# Patient Record
Sex: Female | Born: 1970 | Race: White | Hispanic: No | Marital: Single | Smoking: Current every day smoker
Health system: Southern US, Community
[De-identification: ages and names within clinical notes are randomized; demographics above are authoritative.]

## PROBLEM LIST (undated history)

## (undated) DIAGNOSIS — J45909 Unspecified asthma, uncomplicated: Secondary | ICD-10-CM

## (undated) DIAGNOSIS — I10 Essential (primary) hypertension: Secondary | ICD-10-CM

## (undated) DIAGNOSIS — F419 Anxiety disorder, unspecified: Secondary | ICD-10-CM

## (undated) DIAGNOSIS — I209 Angina pectoris, unspecified: Secondary | ICD-10-CM

## (undated) DIAGNOSIS — F32A Depression, unspecified: Secondary | ICD-10-CM

## (undated) DIAGNOSIS — F329 Major depressive disorder, single episode, unspecified: Secondary | ICD-10-CM

## (undated) HISTORY — PX: APPENDECTOMY: SHX54

---

## 2015-04-05 DIAGNOSIS — F17210 Nicotine dependence, cigarettes, uncomplicated: Secondary | ICD-10-CM | POA: Insufficient documentation

## 2015-08-19 ENCOUNTER — Encounter (HOSPITAL_BASED_OUTPATIENT_CLINIC_OR_DEPARTMENT_OTHER): Payer: Self-pay | Admitting: *Deleted

## 2015-08-19 ENCOUNTER — Emergency Department (HOSPITAL_BASED_OUTPATIENT_CLINIC_OR_DEPARTMENT_OTHER)
Admission: EM | Admit: 2015-08-19 | Discharge: 2015-08-20 | Disposition: A | Discharge status: Home / Self Care | Payer: Medicaid Other | Attending: Emergency Medicine | Admitting: Emergency Medicine

## 2015-08-19 DIAGNOSIS — F17210 Nicotine dependence, cigarettes, uncomplicated: Secondary | ICD-10-CM | POA: Insufficient documentation

## 2015-08-19 DIAGNOSIS — R6884 Jaw pain: Secondary | ICD-10-CM | POA: Diagnosis not present

## 2015-08-19 DIAGNOSIS — I10 Essential (primary) hypertension: Secondary | ICD-10-CM | POA: Diagnosis not present

## 2015-08-19 DIAGNOSIS — R079 Chest pain, unspecified: Secondary | ICD-10-CM | POA: Insufficient documentation

## 2015-08-19 HISTORY — DX: Angina pectoris, unspecified: I20.9

## 2015-08-19 HISTORY — DX: Essential (primary) hypertension: I10

## 2015-08-19 NOTE — ED Notes (Signed)
Pt. Reports chest pain with jaw pain and chest pain radiating to the back with c/o nausea.

## 2015-08-20 ENCOUNTER — Emergency Department (HOSPITAL_BASED_OUTPATIENT_CLINIC_OR_DEPARTMENT_OTHER): Payer: Medicaid Other

## 2015-08-20 LAB — COMPREHENSIVE METABOLIC PANEL
ALBUMIN: 3.8 g/dL (ref 3.5–5.0)
ALK PHOS: 61 U/L (ref 38–126)
ALT: 10 U/L — AB (ref 14–54)
ANION GAP: 9 (ref 5–15)
AST: 13 U/L — ABNORMAL LOW (ref 15–41)
BILIRUBIN TOTAL: 0.3 mg/dL (ref 0.3–1.2)
BUN: 15 mg/dL (ref 6–20)
CALCIUM: 9.1 mg/dL (ref 8.9–10.3)
CO2: 20 mmol/L — ABNORMAL LOW (ref 22–32)
CREATININE: 0.86 mg/dL (ref 0.44–1.00)
Chloride: 110 mmol/L (ref 101–111)
GFR calc non Af Amer: >60 mL/min (ref >60)
GLUCOSE: 106 mg/dL — AB (ref 65–99)
Potassium: 3.9 mmol/L (ref 3.5–5.1)
Sodium: 139 mmol/L (ref 135–145)
TOTAL PROTEIN: 6.6 g/dL (ref 6.5–8.1)

## 2015-08-20 LAB — CBC WITH DIFFERENTIAL/PLATELET
BASOS ABS: 0 10*3/uL (ref 0.0–0.1)
BASOS PCT: 0 %
EOS ABS: 0.2 10*3/uL (ref 0.0–0.7)
Eosinophils Relative: 2 %
HEMATOCRIT: 36.5 % (ref 36.0–46.0)
Hemoglobin: 12.2 g/dL (ref 12.0–15.0)
Lymphocytes Relative: 34 %
Lymphs Abs: 2.6 10*3/uL (ref 0.7–4.0)
MCH: 27 pg (ref 26.0–34.0)
MCHC: 33.4 g/dL (ref 30.0–36.0)
MCV: 80.8 fL (ref 78.0–100.0)
MONO ABS: 0.7 10*3/uL (ref 0.1–1.0)
Monocytes Relative: 9 %
NEUTROS ABS: 4.3 10*3/uL (ref 1.7–7.7)
Neutrophils Relative %: 55 %
PLATELETS: 216 10*3/uL (ref 150–400)
RBC: 4.52 MIL/uL (ref 3.87–5.11)
RDW: 15.9 % — AB (ref 11.5–15.5)
WBC: 7.7 10*3/uL (ref 4.0–10.5)

## 2015-08-20 LAB — TROPONIN I

## 2015-08-20 MED ORDER — KETOROLAC TROMETHAMINE 60 MG/2ML IM SOLN
60.0000 mg | Freq: Once | INTRAMUSCULAR | Status: AC
  Administered 2015-08-20: 60 mg via INTRAMUSCULAR
  Filled 2015-08-20: qty 2

## 2015-08-20 MED ORDER — TRAMADOL HCL 50 MG PO TABS: 50.0000 mg | ORAL_TABLET | Freq: Four times a day (QID) | ORAL | Status: DC | PRN

## 2015-08-20 NOTE — Discharge Instructions (Signed)
Ibuprofen 600 mg 3 times daily for the next 5 days.  Tramadol as prescribed as needed for pain not relieved with ibuprofen.  Follow-up with your primary Dr. if things are not improving in the next 2-3 days, and return to the emergency department if symptoms significantly worsen or change.   Chest Wall Pain Chest wall pain is pain in or around the bones and muscles of your chest. Sometimes, an injury causes this pain. Sometimes, the cause may not be known. This pain may take several weeks or longer to get better. HOME CARE INSTRUCTIONS  Pay attention to any changes in your symptoms. Take these actions to help with your pain:   Rest as told by your health care provider.   Avoid activities that cause pain. These include any activities that use your chest muscles or your abdominal and side muscles to lift heavy items.   If directed, apply ice to the painful area:  Put ice in a plastic bag.  Place a towel between your skin and the bag.  Leave the ice on for 20 minutes, 2-3 times per day.  Take over-the-counter and prescription medicines only as told by your health care provider.  Do not use tobacco products, including cigarettes, chewing tobacco, and e-cigarettes. If you need help quitting, ask your health care provider.  Keep all follow-up visits as told by your health care provider. This is important. SEEK MEDICAL CARE IF:  You have a fever.  Your chest pain becomes worse.  You have new symptoms. SEEK IMMEDIATE MEDICAL CARE IF:  You have nausea or vomiting.  You feel sweaty or light-headed.  You have a cough with phlegm (sputum) or you cough up blood.  You develop shortness of breath.   This information is not intended to replace advice given to you by your health care provider. Make sure you discuss any questions you have with your health care provider.   Document Released: 04/13/2005 Document Revised: 01/02/2015 Document Reviewed: 07/09/2014 Elsevier Interactive  Patient Education [employer]2016 [employer] Inc.

## 2015-08-20 NOTE — ED Provider Notes (Signed)
CSN: [id number]     Arrival date & time 08/19/15  2341 History  By signing my name below, I, Linus Galas, attest that this documentation has been prepared under the direction and in the presence of Geoffery Lyons, MD. Electronically Signed: Linus Galas, ED Scribe. 08/20/2015. 12:04 AM.   Chief Complaint  Patient presents with  . Chest Pain   The history is provided by the patient. No language interpreter was used.   HPI Comments: Katherine Farrell is a 45 y.o. female who presents to the Emergency Department with a PMHx of HTN and angina pectoris complaining of achy chest pain that began an hour ago. Pt states her pain is closer to her left shoulder and "shoots to her back." Pt also reports nausea, mild SOB and a feeling in her jaw as if "she ate a whole bag of warheads." Pt states her CP is worse with movement. Pt denies any cough, vomiting, diarrhea, or any other symptoms at this time. Pt is not physical activity and denies any recent strenuous activity. She smokes .5 pack of cigarettes a day.   Pt has family history of cardiac chest pain. Pt mother began have pain when she was in her 12s.  Pt has seen a doctor for her chest pain. She was dx with angina and prescribed metoprolol and nitroglycerine. Pt has had several negative stress tests.   Pt recently moved from [place].   Past Medical History  Diagnosis Date  . Angina pectoris (HCC)     per Pt. heart valve issues.  . Hypertension    Past Surgical History  Procedure Laterality Date  . Appendectomy     No family history on file. Social History  Substance Use Topics  . Smoking status: Current Every Day Smoker -- 0.50 packs/day    Types: Cigarettes  . Smokeless tobacco: Not on file  . Alcohol Use: Yes     Comment: occasional   OB History    No data available     Review of Systems A complete 10 system review of systems was obtained and all systems are negative except as noted in the HPI and PMH.   Allergies  Review of  patient's allergies indicates no known allergies.  Home Medications   Prior to Admission medications   Not on File   BP 196/108 mmHg  Pulse 56  Temp(Src) 98.6 F (37 C) (Oral)  Resp 20  Ht  (1.651 m)  Wt 120 lb (54.432 kg)  BMI 19.97 kg/m2  SpO2 98%  LMP 08/19/2015 Physical Exam  Constitutional: She is oriented to person, place, and time. She appears well-developed and well-nourished. No distress.  HENT:  Head: Normocephalic and atraumatic.  Eyes: EOM are normal. Pupils are equal, round, and reactive to light.  Neck: Normal range of motion.  Cardiovascular: Normal rate, regular rhythm and normal heart sounds.   No murmur heard. Pulmonary/Chest: Effort normal and breath sounds normal. No respiratory distress. She has no wheezes. She has no rales. She exhibits tenderness.  TTP to the anterior chest wall.  Abdominal: Soft. Bowel sounds are normal. She exhibits no distension. There is no tenderness.  Musculoskeletal: Normal range of motion.  Neurological: She is alert and oriented to person, place, and time.  Skin: Skin is warm and dry.  Psychiatric: She has a normal mood and affect. Judgment normal.  Nursing note and vitals reviewed.   ED Course  Procedures  DIAGNOSTIC STUDIES: Oxygen Saturation is 98% on room air, normal by my interpretation.  COORDINATION OF CARE: 12:03 AM Will give Toradol. Will order CXR, EKG, and blood work. Discussed treatment plan with pt at bedside and pt agreed to plan.  Labs Review Labs Reviewed - No data to display  Imaging Review No results found. I have personally reviewed and evaluated these images and lab results as part of my medical decision-making.   EKG Interpretation   Date/Time:  Monday August 19 2015 23:52:16 EDT Ventricular Rate:  58 PR Interval:  122 QRS Duration: 80 QT Interval:  386 QTC Calculation: 378 R Axis:   76 Text Interpretation:  Sinus bradycardia Baseline wander Otherwise normal  ECG Confirmed by Reylene Stauder   MD, Emilyn Ruble ([id number]) on 08/19/2015 11:59:09 PM      MDM   Final diagnoses:  None    Patient presents with complaints of chest pain. Her discomfort is very reproducible with palpation of the chest wall and I highly suspect a musculoskeletal etiology. It appears as though this is not a new phenomenon for her. She reports similar episodes while living in [place]. She recently relocated here and has no primary doctor. Her EKG and troponin are negative and I feel as though she is appropriate for discharge. She was advised to obtain a primary Dr. here in the area. Her initial blood pressure was significantly elevated, however improved dramatically while she was here. She is to continue to follow this at home and keep a record of it.  I personally performed the services described in this documentation, which was scribed in my presence. The recorded information has been reviewed and is accurate.       Geoffery Lyonsouglas Aundra Pung, MD 08/20/15 0330

## 2015-08-20 NOTE — ED Notes (Signed)
Pt. Has had no insurance to get her regular meds  B/P meds for approx. 1 year.

## 2015-09-14 DIAGNOSIS — F332 Major depressive disorder, recurrent severe without psychotic features: Secondary | ICD-10-CM | POA: Insufficient documentation

## 2016-01-01 ENCOUNTER — Emergency Department (HOSPITAL_BASED_OUTPATIENT_CLINIC_OR_DEPARTMENT_OTHER)
Admission: EM | Admit: 2016-01-01 | Discharge: 2016-01-01 | Disposition: A | Discharge status: Home / Self Care | Payer: Medicaid Other | Attending: Emergency Medicine | Admitting: Emergency Medicine

## 2016-01-01 ENCOUNTER — Encounter (HOSPITAL_BASED_OUTPATIENT_CLINIC_OR_DEPARTMENT_OTHER): Payer: Self-pay | Admitting: Emergency Medicine

## 2016-01-01 DIAGNOSIS — I10 Essential (primary) hypertension: Secondary | ICD-10-CM | POA: Diagnosis not present

## 2016-01-01 DIAGNOSIS — F17210 Nicotine dependence, cigarettes, uncomplicated: Secondary | ICD-10-CM | POA: Insufficient documentation

## 2016-01-01 DIAGNOSIS — J45909 Unspecified asthma, uncomplicated: Secondary | ICD-10-CM | POA: Diagnosis not present

## 2016-01-01 DIAGNOSIS — R0602 Shortness of breath: Secondary | ICD-10-CM | POA: Diagnosis present

## 2016-01-01 DIAGNOSIS — Z79899 Other long term (current) drug therapy: Secondary | ICD-10-CM | POA: Insufficient documentation

## 2016-01-01 HISTORY — DX: Major depressive disorder, single episode, unspecified: F32.9

## 2016-01-01 HISTORY — DX: Unspecified asthma, uncomplicated: J45.909

## 2016-01-01 HISTORY — DX: Anxiety disorder, unspecified: F41.9

## 2016-01-01 HISTORY — DX: Depression, unspecified: F32.A

## 2016-01-01 MED ORDER — ALBUTEROL SULFATE HFA 108 (90 BASE) MCG/ACT IN AERS
1.0000 | INHALATION_SPRAY | Freq: Once | RESPIRATORY_TRACT | Status: AC
  Administered 2016-01-01: 2 via RESPIRATORY_TRACT
  Filled 2016-01-01: qty 6.7

## 2016-01-01 NOTE — ED Triage Notes (Addendum)
Cough and SOB intermittently for "couple days".  Worse tonight. Out of her inhaler and pmd will not refill again until until seen.  Breath sounds clear in triage.

## 2016-01-01 NOTE — Discharge Instructions (Signed)
Please read attached information. If you experience any new or worsening signs or symptoms please return to the emergency room for evaluation. Please follow-up with your primary care provider or specialist as discussed. Please use medication prescribed only as directed and discontinue taking if you have any concerning signs or symptoms.   °

## 2016-01-01 NOTE — ED Provider Notes (Signed)
[facility]   CSN: [id number] Arrival date & time: 01/01/16  2257     History   Chief Complaint Chief Complaint  Patient presents with  . Shortness of Breath    HPI Katherine Farrell is a 45 y.o. female.  HPI   45 year old female presents today with complaints of asthma exacerbation. Patient reports a significant past history of asthma allergies. She reports that over the last several days she's had increased shortness of breath, chest tightness, and mild cough. She reports this is identical to previous asthma related symptoms. Patient reports that she has run out of her nebulizer at home, has not used any medications. Patient denies any chest pain, abdominal pain, nausea vomiting or diarrhea. She denies any history of DVT or PE, or any significant risk factors.  Past Medical History:  Diagnosis Date  . Angina pectoris (HCC)    per Pt. heart valve issues.  . Anxiety   . Asthma   . Depression   . Hypertension     There are no active problems to display for this patient.   Past Surgical History:  Procedure Laterality Date  . APPENDECTOMY      OB History    No data available     Home Medications    Prior to Admission medications   Medication Sig Start Date End Date Taking? Authorizing Provider  escitalopram (LEXAPRO) 20 MG tablet Take 40 mg by mouth daily.   Yes Historical Provider, MD  GABAPENTIN PO Take 200 mg by mouth daily.   Yes Historical Provider, MD  metoprolol tartrate (LOPRESSOR) 25 MG tablet Take 25 mg by mouth daily.   Yes Historical Provider, MD  traMADol (ULTRAM) 50 MG tablet Take 1 tablet (50 mg total) by mouth every 6 (six) hours as needed. 08/20/15   Geoffery Lyonsouglas Delo, MD    Family History No family history on file.  Social History Social History  Substance Use Topics  . Smoking status: Current Every Day Smoker    Packs/day: 1.00    Types: Cigarettes  . Smokeless tobacco: Never Used  . Alcohol use Yes     Comment:  occasional     Allergies   Review of patient's allergies indicates no known allergies.   Review of Systems Review of Systems  All other systems reviewed and are negative.    Physical Exam Updated Vital Signs BP 133/83 (BP Location: Left Arm)   Pulse (!) 55   Temp 97.9 F (36.6 C) (Oral)   Resp 16   Ht 5\' 4"  (1.626 m)   Wt 59 kg   LMP 12/22/2015 (Exact Date)   SpO2 99%   BMI 22.31 kg/m   Physical Exam  Constitutional: She is oriented to person, place, and time. She appears well-developed and well-nourished.  HENT:  Head: Normocephalic and atraumatic.  Eyes: Conjunctivae are normal. Pupils are equal, round, and reactive to light. Right eye exhibits no discharge. Left eye exhibits no discharge. No scleral icterus.  Neck: Normal range of motion. No JVD present. No tracheal deviation present.  Cardiovascular: Normal rate, regular rhythm, normal heart sounds and intact distal pulses.   Pulmonary/Chest: Effort normal and breath sounds normal. No stridor. No respiratory distress. She has no wheezes. She has no rales. She exhibits no tenderness.  Musculoskeletal: She exhibits no edema.  Neurological: She is alert and oriented to person, place, and time. Coordination normal.  Psychiatric: She has a normal mood and affect. Her behavior is normal. Judgment and thought content  normal.  Nursing note and vitals reviewed.    ED Treatments / Results  Labs (all labs ordered are listed, but only abnormal results are displayed) Labs Reviewed - No data to display  EKG  EKG Interpretation None      Radiology No results found.  Procedures Procedures (including critical care time)  Medications Ordered in ED Medications  albuterol (PROVENTIL HFA;VENTOLIN HFA) 108 (90 Base) MCG/ACT inhaler 1-2 puff (2 puffs Inhalation Given 01/01/16 2341)     Initial Impression / Assessment and Plan / ED Course  I have reviewed the triage vital signs and the nursing notes.  Pertinent labs &  imaging results that were available during my care of the patient were reviewed by me and considered in my medical decision making (see chart for details).  Clinical Course    Final Clinical Impressions(s) / ED Diagnoses   Final diagnoses:  Asthma, unspecified asthma severity, uncomplicated   Labs:  Imaging:  Consults:  Therapeutics:  Discharge Meds:   Assessment/Plan:  45 year old female presents today with likely asthma exacerbation. Patient has reassuring vital signs, clear lung sounds. Patient reports this is similar to previous asthma related symptoms. She will be given an inhaler here in the ED and discharged home with symptomatic care instructions strict return precautions. Patient has no signs of PE DVT, denies any chest pain.    New Prescriptions New Prescriptions   No medications on file     Katherine Mechanic, PA-C 01/01/16 2344    Katherine Libra, MD 01/02/16 [phone]

## 2017-05-18 ENCOUNTER — Other Ambulatory Visit: Payer: Self-pay

## 2017-05-18 ENCOUNTER — Emergency Department (HOSPITAL_BASED_OUTPATIENT_CLINIC_OR_DEPARTMENT_OTHER)
Admission: EM | Admit: 2017-05-18 | Discharge: 2017-05-18 | Disposition: A | Discharge status: Home / Self Care | Payer: PRIVATE HEALTH INSURANCE | Attending: Emergency Medicine | Admitting: Emergency Medicine

## 2017-05-18 ENCOUNTER — Encounter (HOSPITAL_BASED_OUTPATIENT_CLINIC_OR_DEPARTMENT_OTHER): Payer: Self-pay

## 2017-05-18 DIAGNOSIS — I10 Essential (primary) hypertension: Secondary | ICD-10-CM | POA: Diagnosis not present

## 2017-05-18 DIAGNOSIS — F17210 Nicotine dependence, cigarettes, uncomplicated: Secondary | ICD-10-CM | POA: Insufficient documentation

## 2017-05-18 DIAGNOSIS — J111 Influenza due to unidentified influenza virus with other respiratory manifestations: Secondary | ICD-10-CM | POA: Diagnosis not present

## 2017-05-18 DIAGNOSIS — B349 Viral infection, unspecified: Secondary | ICD-10-CM

## 2017-05-18 DIAGNOSIS — Z79899 Other long term (current) drug therapy: Secondary | ICD-10-CM | POA: Diagnosis not present

## 2017-05-18 DIAGNOSIS — J45909 Unspecified asthma, uncomplicated: Secondary | ICD-10-CM | POA: Insufficient documentation

## 2017-05-18 DIAGNOSIS — R509 Fever, unspecified: Secondary | ICD-10-CM | POA: Diagnosis present

## 2017-05-18 DIAGNOSIS — R69 Illness, unspecified: Secondary | ICD-10-CM

## 2017-05-18 MED ORDER — BENZONATATE 100 MG PO CAPS: 100.0000 mg | ORAL_CAPSULE | Freq: Three times a day (TID) | ORAL | 0 refills | Status: AC

## 2017-05-18 MED ORDER — PROMETHAZINE-DM 6.25-15 MG/5ML PO SYRP: 5.0000 mL | ORAL_SOLUTION | Freq: Four times a day (QID) | ORAL | 0 refills | Status: AC | PRN

## 2017-05-18 NOTE — ED Triage Notes (Addendum)
Pt c/o body aches, fever, cough and sore throat, has been taking tylenol, last dose was at 1745, pt is very upset about having to wear a mask, states that she cannot breathe in it, exposure to another person with flu and PNA

## 2017-05-18 NOTE — ED Provider Notes (Signed)
[facility] EMERGENCY DEPARTMENT Provider Note   CSN: [id number] Arrival date & time: 05/18/17  1757     History   Chief Complaint Chief Complaint  Patient presents with  . URI    HPI Katherine Farrell is a 47 y.o. female. CC: fever and cough.  HPI` 47 year old female.  36-hour illness.  Awakened yesterday with symptoms.  Fevers, cough, body aches.  No nausea or vomiting.  Headache and sore throat.  "Feels like I got hit by a truck".  Was not vaccinated for influenza.  Past Medical History:  Diagnosis Date  . Angina pectoris (HCC)    per Pt. heart valve issues.  . Anxiety   . Asthma   . Depression   . Hypertension     There are no active problems to display for this patient.   Past Surgical History:  Procedure Laterality Date  . APPENDECTOMY      OB History    No data available       Home Medications    Prior to Admission medications   Medication Sig Start Date End Date Taking? Authorizing Provider  benzonatate (TESSALON) 100 MG capsule Take 1 capsule (100 mg total) by mouth every 8 (eight) hours. 05/18/17   Rolland Porter, MD  escitalopram (LEXAPRO) 20 MG tablet Take 40 mg by mouth daily.    [provider]  GABAPENTIN PO Take 200 mg by mouth daily.    [provider]  metoprolol tartrate (LOPRESSOR) 25 MG tablet Take 25 mg by mouth daily.    [provider]  promethazine-dextromethorphan (PROMETHAZINE-DM) 6.25-15 MG/5ML syrup Take 5 mLs by mouth 4 (four) times daily as needed for cough. 05/18/17   Rolland Porter, MD  traMADol (ULTRAM) 50 MG tablet Take 1 tablet (50 mg total) by mouth every 6 (six) hours as needed. 08/20/15   Geoffery Lyons, MD    Family History No family history on file.  Social History Social History   Tobacco Use  . Smoking status: Current Every Day Smoker    Packs/day: 1.00    Types: Cigarettes  . Smokeless tobacco: Never Used  Substance Use Topics  . Alcohol use: Yes    Comment: occasional  . Drug  use: No     Allergies   Patient has no known allergies.   Review of Systems Review of Systems  Constitutional: Positive for chills, fatigue and fever. Negative for appetite change and diaphoresis.  HENT: Positive for sore throat. Negative for mouth sores and trouble swallowing.   Eyes: Negative for visual disturbance.  Respiratory: Positive for cough. Negative for chest tightness, shortness of breath and wheezing.   Cardiovascular: Negative for chest pain.  Gastrointestinal: Negative for abdominal distention, abdominal pain, diarrhea, nausea and vomiting.  Endocrine: Negative for polydipsia, polyphagia and polyuria.  Genitourinary: Negative for dysuria, frequency and hematuria.  Musculoskeletal: Positive for myalgias. Negative for gait problem.  Skin: Negative for color change, pallor and rash.  Neurological: Positive for headaches. Negative for dizziness, syncope and light-headedness.  Hematological: Does not bruise/bleed easily.  Psychiatric/Behavioral: Negative for behavioral problems and confusion.     Physical Exam Updated Vital Signs BP 137/83 (BP Location: Left Arm)   Pulse (!) 57   Temp 98 F (36.7 C) (Oral)   Resp 18   Ht 5\' 4"  (1.626 m)   Wt 59 kg (130 lb)   LMP 04/20/2017   SpO2 100%   BMI 22.31 kg/m   Physical Exam  Constitutional: She is oriented to person, place,  and time. She appears well-developed and well-nourished. No distress.  Awake and alert.  Appears to not feel well but not toxic.  Mentating well.  Not tachypneic.  Well oxygenated.  Temp 100.1.  HENT:  Head: Normocephalic.  Eyes: Conjunctivae are normal. Pupils are equal, round, and reactive to light. No scleral icterus.  Neck: Normal range of motion. Neck supple. No thyromegaly present.  Cardiovascular: Normal rate and regular rhythm. Exam reveals no gallop and no friction rub.  No murmur heard. Pulmonary/Chest: Effort normal and breath sounds normal. No respiratory distress. She has no  wheezes. She has no rales.  Clear bilateral breath sounds.  No wheezing or prolongation.  No focal diminished breath sounds.  No increased work of breathing.  Abdominal: Soft. Bowel sounds are normal. She exhibits no distension. There is no tenderness. There is no rebound.  Musculoskeletal: Normal range of motion.  Neurological: She is alert and oriented to person, place, and time.  Skin: Skin is warm and dry. No rash noted.  Psychiatric: She has a normal mood and affect. Her behavior is normal.     ED Treatments / Results  Labs (all labs ordered are listed, but only abnormal results are displayed) Labs Reviewed - No data to display  EKG  EKG Interpretation None       Radiology No results found.  Procedures Procedures (including critical care time)  Medications Ordered in ED Medications - No data to display   Initial Impression / Assessment and Plan / ED Course  I have reviewed the triage vital signs and the nursing notes.  Pertinent labs & imaging results that were available during my care of the patient were reviewed by me and considered in my medical decision making (see chart for details).    Double influenza.  Had a Tamiflu discussion with her.  She politely declines.  Plan symptomatic treatment.  Rest, fluids, Motrin, Tylenol, cough suppressants.  Primary care follow-up if not improving.  Final Clinical Impressions(s) / ED Diagnoses   Final diagnoses:  Viral syndrome  Influenza-like illness    ED Discharge Orders        Ordered    promethazine-dextromethorphan (PROMETHAZINE-DM) 6.25-15 MG/5ML syrup  4 times daily PRN     05/18/17 2006    benzonatate (TESSALON) 100 MG capsule  Every 8 hours     05/18/17 2006       Rolland PorterJames, Inesha Sow, MD 05/18/17 2010

## 2017-05-18 NOTE — Discharge Instructions (Signed)
Tessalon for daytime cough.  Phenergan DM for nighttime cough.  Rest.  Push fluids.  Motrin and Tylenol for fever and pain.

## 2019-02-15 DIAGNOSIS — Z87891 Personal history of nicotine dependence: Secondary | ICD-10-CM | POA: Insufficient documentation

## 2019-02-15 DIAGNOSIS — I10 Essential (primary) hypertension: Secondary | ICD-10-CM | POA: Insufficient documentation

## 2019-02-15 DIAGNOSIS — F321 Major depressive disorder, single episode, moderate: Secondary | ICD-10-CM | POA: Insufficient documentation

## 2019-02-15 DIAGNOSIS — F411 Generalized anxiety disorder: Secondary | ICD-10-CM | POA: Insufficient documentation

## 2019-02-15 DIAGNOSIS — Z8709 Personal history of other diseases of the respiratory system: Secondary | ICD-10-CM | POA: Insufficient documentation

## 2019-02-16 DIAGNOSIS — E559 Vitamin D deficiency, unspecified: Secondary | ICD-10-CM | POA: Insufficient documentation

## 2019-02-16 DIAGNOSIS — E782 Mixed hyperlipidemia: Secondary | ICD-10-CM | POA: Insufficient documentation

## 2019-03-30 DIAGNOSIS — N921 Excessive and frequent menstruation with irregular cycle: Secondary | ICD-10-CM | POA: Insufficient documentation

## 2019-05-21 ENCOUNTER — Emergency Department (HOSPITAL_BASED_OUTPATIENT_CLINIC_OR_DEPARTMENT_OTHER): Payer: Medicaid Other

## 2019-05-21 ENCOUNTER — Encounter (HOSPITAL_BASED_OUTPATIENT_CLINIC_OR_DEPARTMENT_OTHER): Payer: Self-pay | Admitting: Emergency Medicine

## 2019-05-21 ENCOUNTER — Other Ambulatory Visit: Payer: Self-pay

## 2019-05-21 ENCOUNTER — Emergency Department (HOSPITAL_BASED_OUTPATIENT_CLINIC_OR_DEPARTMENT_OTHER)
Admission: EM | Admit: 2019-05-21 | Discharge: 2019-05-21 | Disposition: A | Discharge status: Home / Self Care | Payer: Medicaid Other | Attending: Emergency Medicine | Admitting: Emergency Medicine

## 2019-05-21 DIAGNOSIS — Z79899 Other long term (current) drug therapy: Secondary | ICD-10-CM | POA: Insufficient documentation

## 2019-05-21 DIAGNOSIS — U071 COVID-19: Secondary | ICD-10-CM | POA: Diagnosis not present

## 2019-05-21 DIAGNOSIS — R5383 Other fatigue: Secondary | ICD-10-CM | POA: Insufficient documentation

## 2019-05-21 DIAGNOSIS — R0602 Shortness of breath: Secondary | ICD-10-CM | POA: Diagnosis present

## 2019-05-21 DIAGNOSIS — I10 Essential (primary) hypertension: Secondary | ICD-10-CM | POA: Diagnosis not present

## 2019-05-21 DIAGNOSIS — F17210 Nicotine dependence, cigarettes, uncomplicated: Secondary | ICD-10-CM | POA: Diagnosis not present

## 2019-05-21 DIAGNOSIS — J4521 Mild intermittent asthma with (acute) exacerbation: Secondary | ICD-10-CM | POA: Diagnosis not present

## 2019-05-21 LAB — CBC WITH DIFFERENTIAL/PLATELET
Abs Immature Granulocytes: 0.04 10*3/uL (ref 0.00–0.07)
Basophils Absolute: 0 10*3/uL (ref 0.0–0.1)
Basophils Relative: 0 %
Eosinophils Absolute: 0.1 10*3/uL (ref 0.0–0.5)
Eosinophils Relative: 1 %
HCT: 40.4 % (ref 36.0–46.0)
Hemoglobin: 12.8 g/dL (ref 12.0–15.0)
Immature Granulocytes: 1 %
Lymphocytes Relative: 22 %
Lymphs Abs: 1.8 10*3/uL (ref 0.7–4.0)
MCH: 26.1 pg (ref 26.0–34.0)
MCHC: 31.7 g/dL (ref 30.0–36.0)
MCV: 82.4 fL (ref 80.0–100.0)
Monocytes Absolute: 0.5 10*3/uL (ref 0.1–1.0)
Monocytes Relative: 6 %
Neutro Abs: 5.8 10*3/uL (ref 1.7–7.7)
Neutrophils Relative %: 70 %
Platelets: 230 10*3/uL (ref 150–400)
RBC: 4.9 MIL/uL (ref 3.87–5.11)
RDW: 14.6 % (ref 11.5–15.5)
WBC: 8.4 10*3/uL (ref 4.0–10.5)
nRBC: 0 % (ref 0.0–0.2)

## 2019-05-21 LAB — BASIC METABOLIC PANEL
Anion gap: 6 (ref 5–15)
BUN: 9 mg/dL (ref 6–20)
CO2: 24 mmol/L (ref 22–32)
Calcium: 8.4 mg/dL — ABNORMAL LOW (ref 8.9–10.3)
Chloride: 108 mmol/L (ref 98–111)
Creatinine, Ser: 0.81 mg/dL (ref 0.44–1.00)
GFR calc Af Amer: >60 mL/min (ref >60)
GFR calc non Af Amer: >60 mL/min (ref >60)
Glucose, Bld: 96 mg/dL (ref 70–99)
Potassium: 3.7 mmol/L (ref 3.5–5.1)
Sodium: 138 mmol/L (ref 135–145)

## 2019-05-21 LAB — D-DIMER, QUANTITATIVE: D-Dimer, Quant: 1.99 ug/mL-FEU — ABNORMAL HIGH (ref 0.00–0.50)

## 2019-05-21 MED ORDER — PREDNISONE 20 MG PO TABS: ORAL_TABLET | ORAL | 0 refills | Status: DC

## 2019-05-21 MED ORDER — ALBUTEROL SULFATE HFA 108 (90 BASE) MCG/ACT IN AERS
4.0000 | INHALATION_SPRAY | Freq: Once | RESPIRATORY_TRACT | Status: AC
  Administered 2019-05-21: 4 via RESPIRATORY_TRACT
  Filled 2019-05-21: qty 6.7

## 2019-05-21 MED ORDER — IOHEXOL 350 MG/ML SOLN
100.0000 mL | Freq: Once | INTRAVENOUS | Status: AC | PRN
  Administered 2019-05-21: 100 mL via INTRAVENOUS

## 2019-05-21 MED ORDER — HYDROCOD POLST-CPM POLST ER 10-8 MG/5ML PO SUER: 5.0000 mL | Freq: Every evening | ORAL | 0 refills | Status: DC | PRN

## 2019-05-21 NOTE — ED Notes (Signed)
ED Provider at bedside. 

## 2019-05-21 NOTE — ED Notes (Signed)
O2 sat 96-98% while ambulating in room

## 2019-05-21 NOTE — ED Triage Notes (Signed)
Pt here COVID +05/10/19 and sx of SOB and fatigue have escalated since then with cough. Dyspnea on exertion.

## 2019-05-21 NOTE — ED Provider Notes (Signed)
[facility] EMERGENCY DEPARTMENT Provider Note   CSN: [id number] Arrival date & time: 05/21/19  1552     History Chief Complaint  Patient presents with  . Shortness of Breath  . Fatigue    Katherine Farrell is a 49 y.o. female.  Patient is a 49 year old female with a history of hypertension, asthma and depression who presents with shortness of breath and cough.  She has been feeling bad for about 10 days.  She was diagnosed with Covid on January 14.  She said that she has had some progressive increase in her shortness of breath over the last few days.  She has a worsening cough.  She finds it hard to sleep at night because of the coughing and the coughing tends to make her more short of breath.  She denies any vomiting or diarrhea.  No fevers.  She does have diffuse myalgias and significant fatigue.  She has been using her albuterol inhaler at home with increased frequency without much improvement in her shortness of breath.        Past Medical History:  Diagnosis Date  . Angina pectoris (HCC)    per Pt. heart valve issues.  . Anxiety   . Asthma   . Depression   . Hypertension     Patient Active Problem List   Diagnosis Date Noted  . Menorrhagia with irregular cycle 03/30/2019  . Mixed hyperlipidemia 02/16/2019  . Vitamin D deficiency 02/16/2019  . Current moderate episode of major depressive disorder without prior episode (HCC) 02/15/2019  . Essential hypertension 02/15/2019  . Generalized anxiety disorder 02/15/2019  . History of asthma 02/15/2019  . History of smoking 02/15/2019  . Severe episode of recurrent major depressive disorder (HCC) 09/14/2015  . Cigarette nicotine dependence, uncomplicated 04/05/2015    Past Surgical History:  Procedure Laterality Date  . APPENDECTOMY       OB History   No obstetric history on file.     History reviewed. No pertinent family history.  Social History   Tobacco Use  . Smoking status: Current Every Day  Smoker    Packs/day: 1.00    Types: Cigarettes  . Smokeless tobacco: Never Used  Substance Use Topics  . Alcohol use: Yes    Comment: occasional  . Drug use: No    Home Medications Prior to Admission medications   Medication Sig Start Date End Date Taking? Authorizing Provider  atorvastatin (LIPITOR) 20 MG tablet Take 20 mg by mouth daily. 05/15/19   [provider]  benzonatate (TESSALON) 100 MG capsule Take 1 capsule (100 mg total) by mouth every 8 (eight) hours. 05/18/17   Rolland Porter, MD  chlorpheniramine-HYDROcodone [facility] ER) 10-8 MG/5ML SUER Take 5 mLs by mouth at bedtime as needed for cough. 05/21/19   Rolan Bucco, MD  escitalopram (LEXAPRO) 20 MG tablet Take 40 mg by mouth daily.    [provider]  GABAPENTIN PO Take 200 mg by mouth daily.    [provider]  metoprolol succinate (TOPROL-XL) 25 MG 24 hr tablet Take 25 mg by mouth daily. 05/12/19   [provider]  metoprolol tartrate (LOPRESSOR) 25 MG tablet Take 25 mg by mouth daily.    [provider]  predniSONE (DELTASONE) 20 MG tablet 3 tabs po day one, then 2 po daily x 4 days 05/21/19   Rolan Bucco, MD  promethazine-dextromethorphan (PROMETHAZINE-DM) 6.25-15 MG/5ML syrup Take 5 mLs by mouth 4 (four) times daily as needed for cough. 05/18/17  Rolland Porter, MD  traMADol (ULTRAM) 50 MG tablet Take 1 tablet (50 mg total) by mouth every 6 (six) hours as needed. 08/20/15   Geoffery Lyons, MD  Vitamin D, Ergocalciferol, (DRISDOL) 1.25 MG (50000 UNIT) CAPS capsule Take 50,000 Units by mouth once a week. 05/14/19   [provider]    Allergies    Amlodipine, Erythromycin base, and Trazodone and nefazodone  Review of Systems   Review of Systems  Constitutional: Positive for fatigue. Negative for chills, diaphoresis and fever.  HENT: Positive for congestion and rhinorrhea. Negative for sneezing.   Eyes: Negative.   Respiratory: Positive for cough, chest  tightness and shortness of breath.   Cardiovascular: Negative for chest pain and leg swelling.  Gastrointestinal: Positive for nausea. Negative for abdominal pain, blood in stool, diarrhea and vomiting.  Genitourinary: Negative for difficulty urinating, flank pain, frequency and hematuria.  Musculoskeletal: Positive for myalgias. Negative for arthralgias and back pain.  Skin: Negative for rash.  Neurological: Positive for headaches. Negative for dizziness, speech difficulty, weakness and numbness.    Physical Exam Updated Vital Signs BP (!) 153/99 (BP Location: Right Arm)   Pulse 72   Temp 97.9 F (36.6 C) (Oral)   Resp 19   SpO2 96%   Physical Exam Constitutional:      Appearance: She is well-developed.  HENT:     Head: Normocephalic and atraumatic.  Eyes:     Pupils: Pupils are equal, round, and reactive to light.  Cardiovascular:     Rate and Rhythm: Normal rate and regular rhythm.     Heart sounds: Normal heart sounds.  Pulmonary:     Effort: Pulmonary effort is normal. No respiratory distress.     Breath sounds: Normal breath sounds. No wheezing or rales.     Comments: No significant tachypnea or increased work of breathing, her action saturations are 99% on room air at rest Chest:     Chest wall: No tenderness.  Abdominal:     General: Bowel sounds are normal.     Palpations: Abdomen is soft.     Tenderness: There is no abdominal tenderness. There is no guarding or rebound.  Musculoskeletal:        General: Normal range of motion.     Cervical back: Normal range of motion and neck supple.  Lymphadenopathy:     Cervical: No cervical adenopathy.  Skin:    General: Skin is warm and dry.     Findings: No rash.  Neurological:     Mental Status: She is alert and oriented to person, place, and time.     ED Results / Procedures / Treatments   Labs (all labs ordered are listed, but only abnormal results are displayed) Labs Reviewed  BASIC METABOLIC PANEL -  Abnormal; Notable for the following components:      Result Value   Calcium 8.4 (*)    All other components within normal limits  D-DIMER, QUANTITATIVE (NOT AT [facility]) - Abnormal; Notable for the following components:   D-Dimer, Quant 1.99 (*)    All other components within normal limits  CBC WITH DIFFERENTIAL/PLATELET    EKG EKG Interpretation  Date/Time:  Sunday May 21 2019 16:01:05 EST Ventricular Rate:  64 PR Interval:    QRS Duration: 113 QT Interval:  391 QTC Calculation: 404 R Axis:   72 Text Interpretation: Sinus rhythm Borderline intraventricular conduction delay Low voltage, precordial leads Baseline wander in lead(s) II aVF V1 V2 since last tracing no significant change  Confirmed by Rolan Bucco [phone]) on 05/21/2019 4:12:11 PM   Radiology CT Angio Chest PE W/Cm &/Or Wo Cm  Result Date: 05/21/2019 CLINICAL DATA:  Cough and shortness of breath. EXAM: CT ANGIOGRAPHY CHEST WITH CONTRAST TECHNIQUE: Multidetector CT imaging of the chest was performed using the standard protocol during bolus administration of intravenous contrast. Multiplanar CT image reconstructions and MIPs were obtained to evaluate the vascular anatomy. CONTRAST:  OMNIPAQUE IOHEXOL 350 MG/ML SOLN COMPARISON:  None. FINDINGS: Cardiovascular: Satisfactory opacification of the pulmonary arteries to the segmental level. No evidence of pulmonary embolism. Normal heart size. No pericardial effusion. Mediastinum/Nodes: No enlarged mediastinal, hilar, or axillary lymph nodes. Thyroid gland, trachea, and esophagus demonstrate no significant findings. Lungs/Pleura: Very mild areas of atelectasis and/or infiltrate are seen along the periphery of the left upper lobe and posterior aspect of the right lung base. There is no evidence of a pleural effusion or pneumothorax. Upper Abdomen: There is a small hiatal hernia. Musculoskeletal: No chest wall abnormality. No acute or significant osseous findings. Review of the MIP  images confirms the above findings. IMPRESSION: 1. No CT evidence of pulmonary embolism. 2. Very mild areas of atelectasis and/or infiltrate along the periphery of the left upper lobe and posterior aspect of the right lung base. 3. Small hiatal hernia. Electronically Signed   By: Aram Candela M.D.   On: 05/21/2019 18:39   DG Chest Port 1 View  Result Date: 05/21/2019 CLINICAL DATA:  Cough and shortness of breath. EXAM: PORTABLE CHEST 1 VIEW COMPARISON:  August 20, 2015 FINDINGS: The heart size and mediastinal contours are within normal limits. Both lungs are clear. The visualized skeletal structures are unremarkable. IMPRESSION: No active disease. Electronically Signed   By: Aram Candela M.D.   On: 05/21/2019 17:06    Procedures Procedures (including critical care time)  Medications Ordered in ED Medications  albuterol (VENTOLIN HFA) 108 (90 Base) MCG/ACT inhaler 4 puff (4 puffs Inhalation Given 05/21/19 1646)  iohexol (OMNIPAQUE) 350 MG/ML injection 100 mL (100 mLs Intravenous Contrast Given 05/21/19 1813)    ED Course  I have reviewed the triage vital signs and the nursing notes.  Pertinent labs & imaging results that were available during my care of the patient were reviewed by me and considered in my medical decision making (see chart for details).    MDM Rules/Calculators/A&P                      Patient is a 49 year old female with a known Covid infection who presents with worsening shortness of breath.  She is mildly tachypneic on arrival although this improved after an albuterol inhaler.  She has no hypoxia.  She was able to ambulate in the room without hypoxia or significant tachypnea.  She has no increased work of breathing.  Her chest x-ray is clear without evidence of pneumonia.  She had a D-dimer which was elevated.  CT scan of her chest was performed which showed no evidence of PE.  There is a small patchy infiltrate which is likely related to the Covid.  Her labs are  nonconcerning.  She is otherwise well-appearing.  She was discharged home in good condition.  Given her underlying asthma, she was started on a 5-day course of prednisone and advised to use her albuterol inhaler as needed.  She was also given a prescription for Tussionex cough syrup.  Return precautions were given.  Covid instructions were given.  Katherine Farrell was evaluated in Emergency Department  on 05/21/2019 for the symptoms described in the history of present illness. She was evaluated in the context of the global COVID-19 pandemic, which necessitated consideration that the patient might be at risk for infection with the SARS-CoV-2 virus that causes COVID-19. Institutional protocols and algorithms that pertain to the evaluation of patients at risk for COVID-19 are in a state of rapid change based on information released by regulatory bodies including the CDC and federal and state organizations. These policies and algorithms were followed during the patient's care in the ED.  Final Clinical Impression(s) / ED Diagnoses Final diagnoses:  Mild intermittent asthma with exacerbation  COVID-19 virus infection    Rx / DC Orders ED Discharge Orders         Ordered    predniSONE (DELTASONE) 20 MG tablet     05/21/19 1851    chlorpheniramine-HYDROcodone (TUSSIONEX PENNKINETIC ER) 10-8 MG/5ML SUER  At bedtime PRN     05/21/19 1851           Malvin Johns, MD 05/21/19 7017

## 2019-05-21 NOTE — ED Notes (Signed)
PT to CT.

## 2020-12-29 ENCOUNTER — Emergency Department (HOSPITAL_BASED_OUTPATIENT_CLINIC_OR_DEPARTMENT_OTHER)
Admission: EM | Admit: 2020-12-29 | Discharge: 2020-12-29 | Disposition: A | Discharge status: Home / Self Care | Payer: Medicaid Other | Attending: Emergency Medicine | Admitting: Emergency Medicine

## 2020-12-29 ENCOUNTER — Other Ambulatory Visit: Payer: Self-pay

## 2020-12-29 ENCOUNTER — Encounter (HOSPITAL_BASED_OUTPATIENT_CLINIC_OR_DEPARTMENT_OTHER): Payer: Self-pay | Admitting: Emergency Medicine

## 2020-12-29 DIAGNOSIS — F17210 Nicotine dependence, cigarettes, uncomplicated: Secondary | ICD-10-CM | POA: Diagnosis not present

## 2020-12-29 DIAGNOSIS — J45909 Unspecified asthma, uncomplicated: Secondary | ICD-10-CM | POA: Diagnosis not present

## 2020-12-29 DIAGNOSIS — B028 Zoster with other complications: Secondary | ICD-10-CM | POA: Diagnosis not present

## 2020-12-29 DIAGNOSIS — R519 Headache, unspecified: Secondary | ICD-10-CM | POA: Insufficient documentation

## 2020-12-29 DIAGNOSIS — H5711 Ocular pain, right eye: Secondary | ICD-10-CM | POA: Diagnosis not present

## 2020-12-29 DIAGNOSIS — Z79899 Other long term (current) drug therapy: Secondary | ICD-10-CM | POA: Diagnosis not present

## 2020-12-29 DIAGNOSIS — I10 Essential (primary) hypertension: Secondary | ICD-10-CM | POA: Insufficient documentation

## 2020-12-29 DIAGNOSIS — R21 Rash and other nonspecific skin eruption: Secondary | ICD-10-CM | POA: Diagnosis present

## 2020-12-29 MED ORDER — OXYCODONE-ACETAMINOPHEN 5-325 MG PO TABS
1.0000 | ORAL_TABLET | Freq: Once | ORAL | Status: AC
  Administered 2020-12-29: 1 via ORAL
  Filled 2020-12-29: qty 1

## 2020-12-29 MED ORDER — OXYCODONE-ACETAMINOPHEN 5-325 MG PO TABS: 1.0000 | ORAL_TABLET | Freq: Four times a day (QID) | ORAL | 0 refills | Status: DC | PRN

## 2020-12-29 MED ORDER — PREDNISONE 10 MG (21) PO TBPK: ORAL_TABLET | Freq: Every day | ORAL | 0 refills | Status: AC

## 2020-12-29 MED ORDER — TETRACAINE HCL 0.5 % OP SOLN
2.0000 [drp] | Freq: Once | OPHTHALMIC | Status: AC
  Administered 2020-12-29: 2 [drp] via OPHTHALMIC
  Filled 2020-12-29: qty 4

## 2020-12-29 MED ORDER — FLUORESCEIN SODIUM 1 MG OP STRP
1.0000 | ORAL_STRIP | Freq: Once | OPHTHALMIC | Status: AC
  Administered 2020-12-29: 1 via OPHTHALMIC
  Filled 2020-12-29: qty 1

## 2020-12-29 MED ORDER — OXYCODONE-ACETAMINOPHEN 5-325 MG PO TABS: 1.0000 | ORAL_TABLET | Freq: Four times a day (QID) | ORAL | 0 refills | Status: AC | PRN

## 2020-12-29 MED ORDER — ACETAMINOPHEN 325 MG PO TABS
650.0000 mg | ORAL_TABLET | Freq: Once | ORAL | Status: AC
  Administered 2020-12-29: 650 mg via ORAL
  Filled 2020-12-29: qty 2

## 2020-12-29 MED ORDER — VALACYCLOVIR HCL 1 G PO TABS: 1000.0000 mg | ORAL_TABLET | Freq: Three times a day (TID) | ORAL | 0 refills | Status: AC

## 2020-12-29 NOTE — ED Provider Notes (Signed)
[facility] EMERGENCY DEPARTMENT Provider Note   CSN: [id number] Arrival date & time: 12/29/20  1938     History Chief Complaint  Patient presents with   Eye Problem    Katherine Farrell is a 50 y.o. female who presents with a few days of severe right-sided face, eye pain, headache.  She reports she has had chickenpox before, and has not shingles vaccine.  She reports the pain came on suddenly a few days ago, she felt like there was something in her eye, but she denies any vision changes.  The rash came on late 2 days ago or yesterday.  Patient has taken some Tylenol and ibuprofen for the pain, with minimal relief.  Patient denies previous shingles action.  Patient rates the pain as 10 out of 10 during her evaluation.  She reports she also has severe headache, some photosensitivity, and believes the rash may be extending up into her hair on the right side.  Patient has no symptoms of the left at all.   Eye Problem Associated symptoms: headaches and redness   Associated symptoms: no discharge       Past Medical History:  Diagnosis Date   Angina pectoris (HCC)    per Pt. heart valve issues.   Anxiety    Asthma    Depression    Hypertension     Patient Active Problem List   Diagnosis Date Noted   Menorrhagia with irregular cycle 03/30/2019   Mixed hyperlipidemia 02/16/2019   Vitamin D deficiency 02/16/2019   Current moderate episode of major depressive disorder without prior episode (HCC) 02/15/2019   Essential hypertension 02/15/2019   Generalized anxiety disorder 02/15/2019   History of asthma 02/15/2019   History of smoking 02/15/2019   Severe episode of recurrent major depressive disorder (HCC) 09/14/2015   Cigarette nicotine dependence, uncomplicated 04/05/2015    Past Surgical History:  Procedure Laterality Date   APPENDECTOMY       OB History   No obstetric history on file.     No family history on file.  Social History   Tobacco Use   Smoking  status: Every Day    Packs/day: 1.00    Types: Cigarettes   Smokeless tobacco: Never  Substance Use Topics   Alcohol use: Yes    Comment: occasional   Drug use: No    Home Medications Prior to Admission medications   Medication Sig Start Date End Date Taking? Authorizing Provider  predniSONE (STERAPRED UNI-PAK 21 TAB) 10 MG (21) TBPK tablet Take by mouth daily. Take 6 tabs by mouth daily  for 2 days, then 5 tabs for 2 days, then 4 tabs for 2 days, then 3 tabs for 2 days, 2 tabs for 2 days, then 1 tab by mouth daily for 2 days 12/29/20  Yes Myreon Wimer H, PA-C  valACYclovir (VALTREX) 1000 MG tablet Take 1 tablet (1,000 mg total) by mouth 3 (three) times daily. 12/29/20  Yes Kelleen Stolze H, PA-C  atorvastatin (LIPITOR) 20 MG tablet Take 20 mg by mouth daily. 05/15/19   [provider]  benzonatate (TESSALON) 100 MG capsule Take 1 capsule (100 mg total) by mouth every 8 (eight) hours. 05/18/17   Rolland Porter, MD  escitalopram (LEXAPRO) 20 MG tablet Take 40 mg by mouth daily.    [provider]  GABAPENTIN PO Take 200 mg by mouth daily.    [provider]  metoprolol succinate (TOPROL-XL) 25 MG 24 hr tablet Take 25 mg by mouth daily.  05/12/19   [provider]  metoprolol tartrate (LOPRESSOR) 25 MG tablet Take 25 mg by mouth daily.    [provider]  oxyCODONE-acetaminophen (PERCOCET/ROXICET) 5-325 MG tablet Take 1-2 tablets by mouth every 6 (six) hours as needed for severe pain. 12/29/20   Arby Barrette, MD  promethazine-dextromethorphan (PROMETHAZINE-DM) 6.25-15 MG/5ML syrup Take 5 mLs by mouth 4 (four) times daily as needed for cough. 05/18/17   Rolland Porter, MD  Vitamin D, Ergocalciferol, (DRISDOL) 1.25 MG (50000 UNIT) CAPS capsule Take 50,000 Units by mouth once a week. 05/14/19   [provider]    Allergies    Amlodipine, Erythromycin base, and Trazodone and nefazodone  Review of Systems   Review of Systems  Constitutional:   Negative for fever.  HENT:  Negative for ear discharge and ear pain.   Eyes:  Positive for pain and redness. Negative for discharge and visual disturbance.  Neurological:  Positive for headaches.  All other systems reviewed and are negative.  Physical Exam Updated Vital Signs BP (!) 175/107 (BP Location: Left Arm)   Pulse (!) 58   Temp 98 F (36.7 C) (Oral)   Resp 18   Ht 5' 4.5" (1.638 m)   Wt 70.3 kg   LMP 12/25/2020   SpO2 99%   BMI 26.19 kg/m   Physical Exam Vitals and nursing note reviewed.  Constitutional:      General: She is not in acute distress.    Appearance: Normal appearance.  HENT:     Head: Normocephalic and atraumatic.  Eyes:     Extraocular Movements: Extraocular movements intact.     Comments: Right eyelid swelling, erythema, extreme tenderness to palpation.  Minimal conjunctival erythema.  No pupillary defect.  Pupils are equal and reactive to light bilaterally, extraocular motions are intact.  Cardiovascular:     Rate and Rhythm: Normal rate and regular rhythm.  Pulmonary:     Effort: Pulmonary effort is normal. No respiratory distress.  Musculoskeletal:        General: No deformity.  Skin:    General: Skin is warm and dry.     Comments: Red, vesicular, scattered rash in a dermatomal distribution, along trigeminal nerve branch 1.  There is no involvement of the lateral nose on the right side or tip of the nose on the right side.  There is extension of the rash the patient's scalp with tenderness.  Neurological:     Mental Status: She is alert and oriented to person, place, and time.  Psychiatric:        Mood and Affect: Mood normal.        Behavior: Behavior normal.      ED Results / Procedures / Treatments   Labs (all labs ordered are listed, but only abnormal results are displayed) Labs Reviewed - No data to display  EKG None  Radiology No results found.  Procedures Procedures   Medications Ordered in ED Medications  fluorescein  ophthalmic strip 1 strip (1 strip Right Eye Given 12/29/20 2226)  tetracaine (PONTOCAINE) 0.5 % ophthalmic solution 2 drop (2 drops Right Eye Given by Other 12/29/20 2225)  acetaminophen (TYLENOL) tablet 650 mg (650 mg Oral Given 12/29/20 2244)  oxyCODONE-acetaminophen (PERCOCET/ROXICET) 5-325 MG per tablet 1 tablet (1 tablet Oral Given 12/29/20 2258)    ED Course  I have reviewed the triage vital signs and the nursing notes.  Pertinent labs & imaging results that were available during my care of the patient were reviewed by me and  considered in my medical decision making (see chart for details).    MDM Rules/Calculators/A&P                         I discussed this case with my attending physician who cosigned this note including patient's presenting symptoms, physical exam, and planned diagnostics and interventions. Attending physician stated agreement with plan or made changes to plan which were implemented.   Attending physician assessed patient at bedside.  Appears to be an acute herpes zoster eruption along the trigeminal V1 distribution.  Patient without any eruption along nose laterally or at tip, discussed this is a poor prognostic risk factor for involvement of the cornea.  Slit lamp and Woods lamp examination does not reveal any uptake of fluorescein dye at any area on the cornea.  Do not favor active zoster lesion of the eye at this time.  Discussed important for patient to follow-up with ophthalmologist, return for reevaluation if she begins having any visual changes.  We will treat with prednisone Dosepak, pain medication, and Valtrex at this time.  Extensive return precautions given. Final Clinical Impression(s) / ED Diagnoses Final diagnoses:  Herpes zoster with complication    Rx / DC Orders ED Discharge Orders          Ordered    predniSONE (STERAPRED UNI-PAK 21 TAB) 10 MG (21) TBPK tablet  Daily        12/29/20 2251    valACYclovir (VALTREX) 1000 MG tablet  3 times daily         12/29/20 2251    oxyCODONE-acetaminophen (PERCOCET/ROXICET) 5-325 MG tablet  Every 6 hours PRN,   Status:  Discontinued        12/29/20 2251    oxyCODONE-acetaminophen (PERCOCET/ROXICET) 5-325 MG tablet  Every 6 hours PRN        12/29/20 2313             Hazleigh Mccleave, Harrel Carina, PA-C 12/29/20 2337    Arby Barrette, MD 01/01/21 [phone]

## 2020-12-29 NOTE — ED Triage Notes (Signed)
Pt reports pain/rash to RT eye; appears consistent with shingles; started [employer]

## 2020-12-29 NOTE — ED Notes (Signed)
ED Provider at bedside. 

## 2020-12-29 NOTE — ED Notes (Signed)
Dr Donnald Garre at bedside to examine patient

## 2020-12-29 NOTE — Discharge Instructions (Addendum)
I did not see any involvement of your cornea today with examination.  I would have a low threshold to seek reevaluation if you begin to have new vision changes, pain on the eye itself.  Take the medications that I prescribed as directed.  Return if the problem worsens or fails to improve.  You can alternate the narcotic pain medication that I gave you with ibuprofen such that you are taking something every 3 hours.

## 2021-05-04 IMAGING — CT CT ANGIO CHEST
2 of 9 series · 19 of 36 positions shown · IV contrast (Omnipaque)
Comparison: None.

CLINICAL DATA: Cough and shortness of breath.

EXAM:
CT ANGIOGRAPHY CHEST WITH CONTRAST
TECHNIQUE: Multidetector CT imaging of the chest was performed using the
standard protocol during bolus administration of intravenous
contrast. Multiplanar CT image reconstructions and MIPs were
obtained to evaluate the vascular anatomy.
CONTRAST:  100mL OMNIPAQUE IOHEXOL 350 MG/ML SOLN

[Series 7: pe thins · axial · 0.75mm/px · z∈[+625,+880]mm · 18 of 287 slices shown]
[im 16/287  lung]
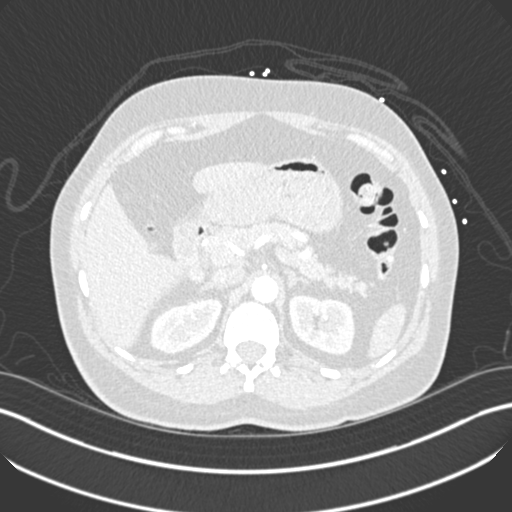
[im 31/287  mediastinal]
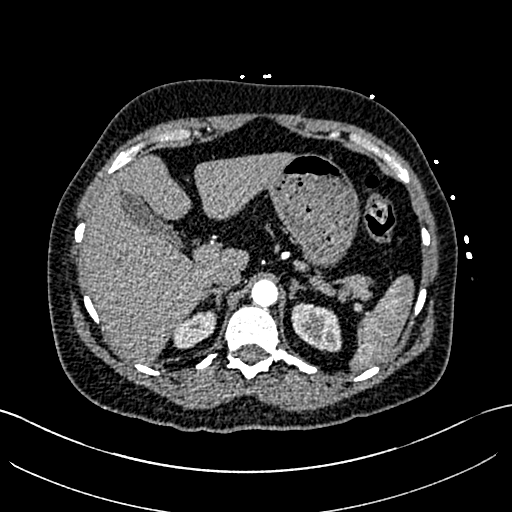
[im 46/287  lung]
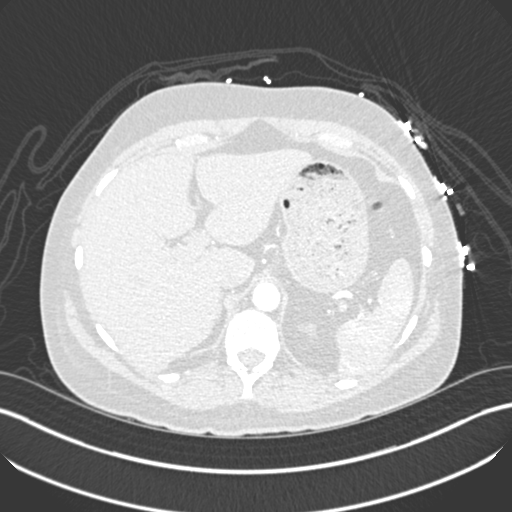
[im 61/287  mediastinal]
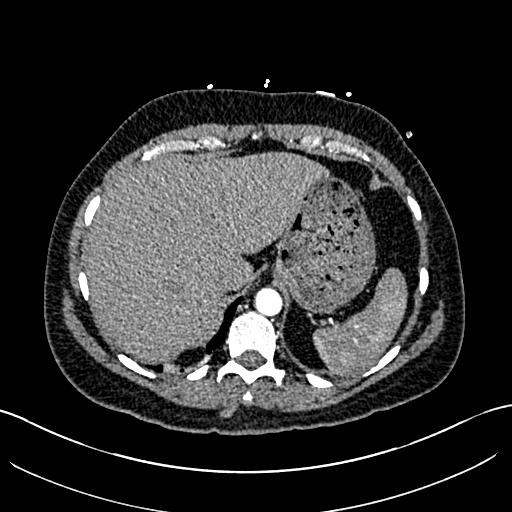
[im 76/287  lung]
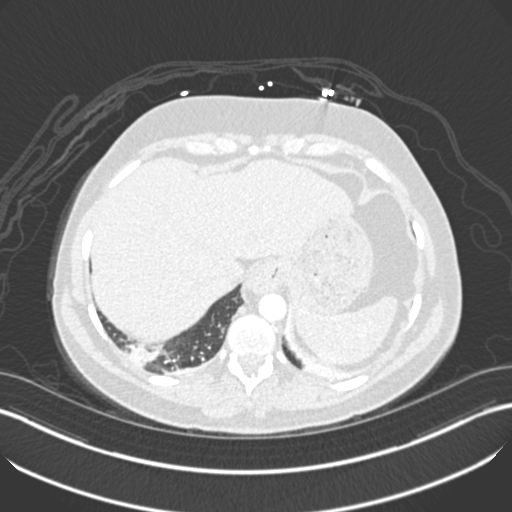
[im 91/287  mediastinal]
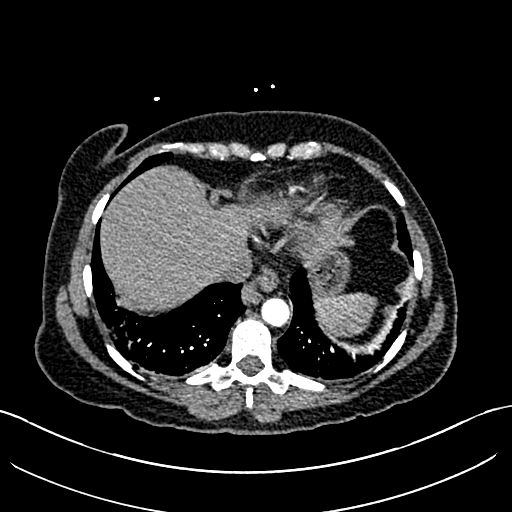
[im 106/287  lung]
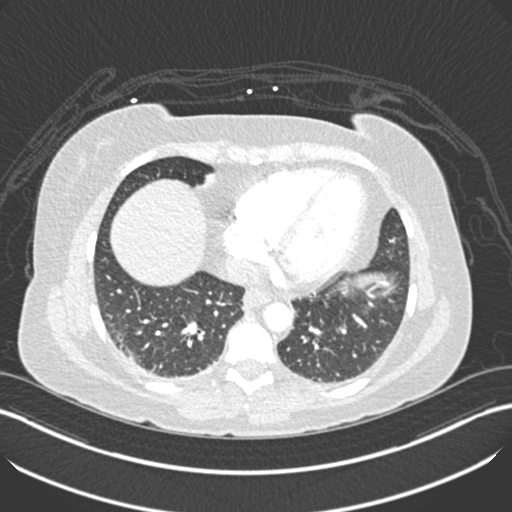
[im 121/287  mediastinal]
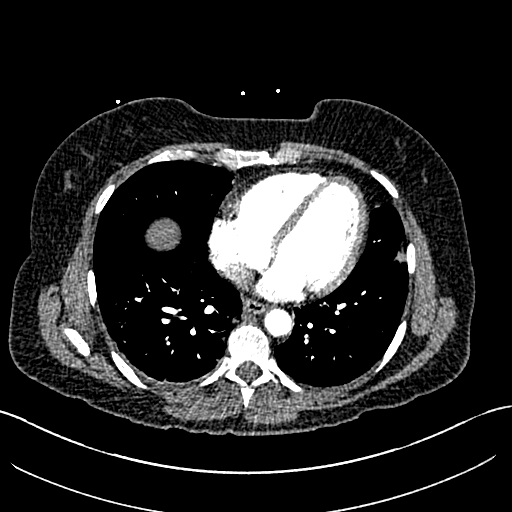
[im 136/287  lung]
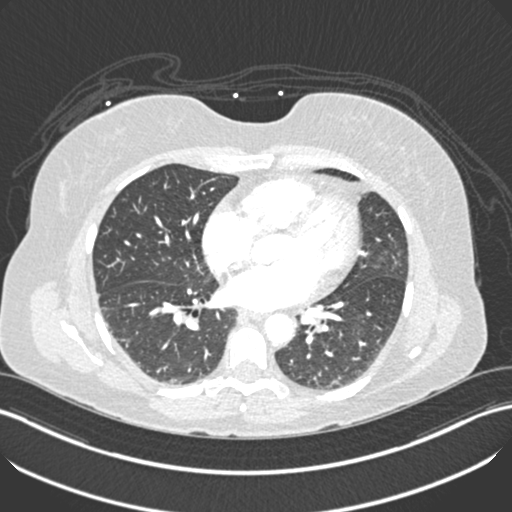
[im 151/287  mediastinal]
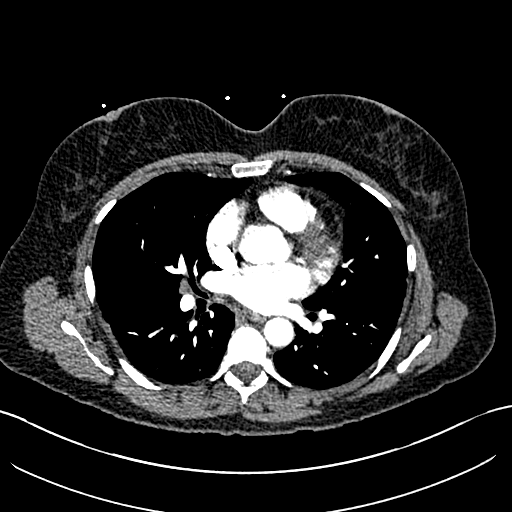
[im 166/287  lung]
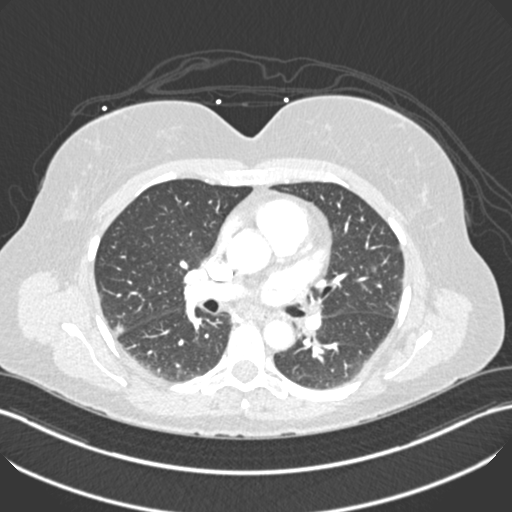
[im 181/287  mediastinal]
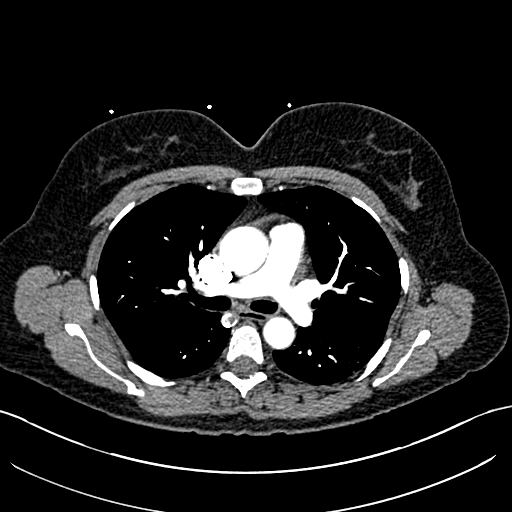
[im 196/287  lung]
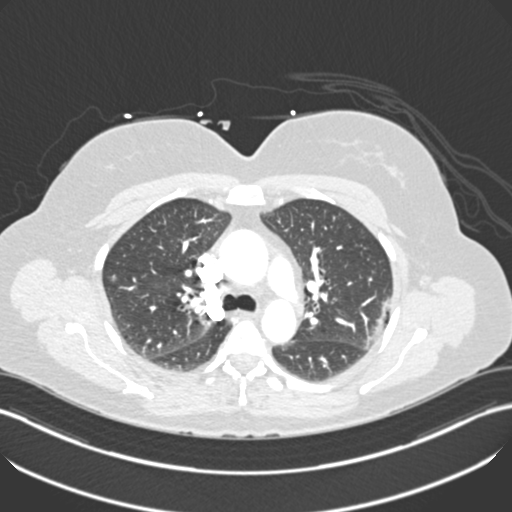
[im 211/287  mediastinal]
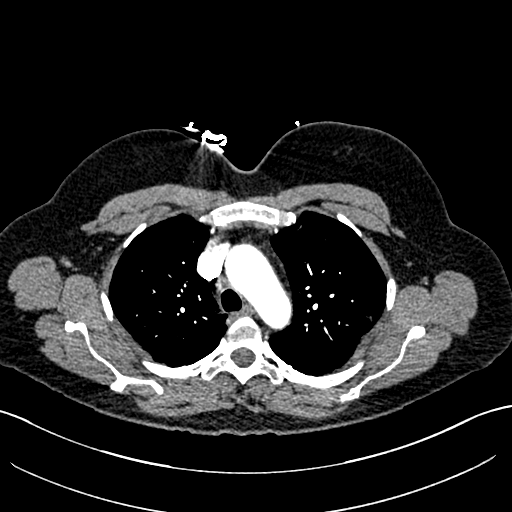
[im 226/287  lung]
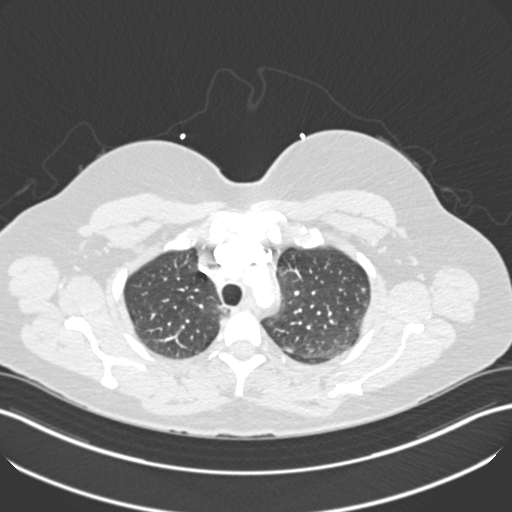
[im 241/287  mediastinal]
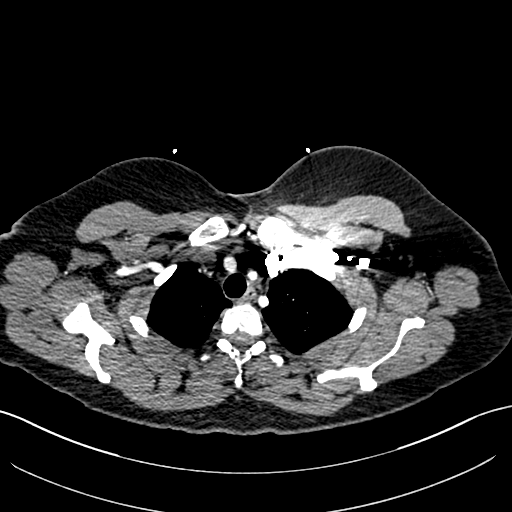
[im 256/287  lung]
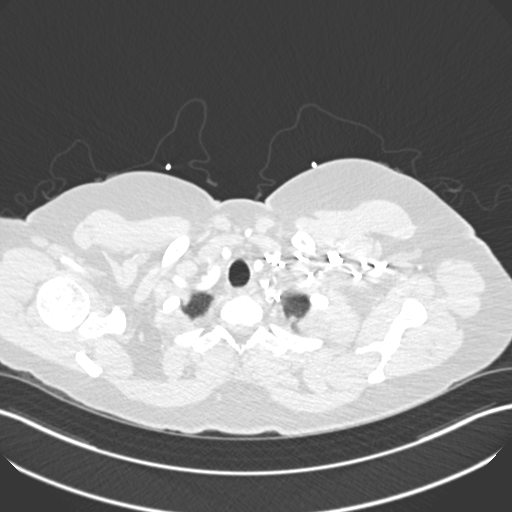
[im 271/287  mediastinal]
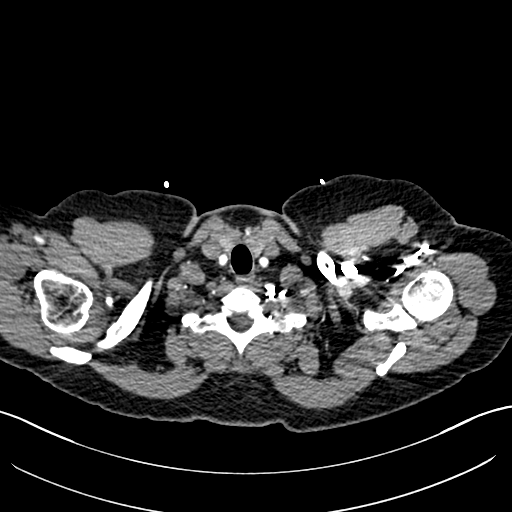

[Series 8: pe coronal mpr · coronal · 0.59mm/px · 1 of 146 slices shown]
[im 73/146  mediastinal]
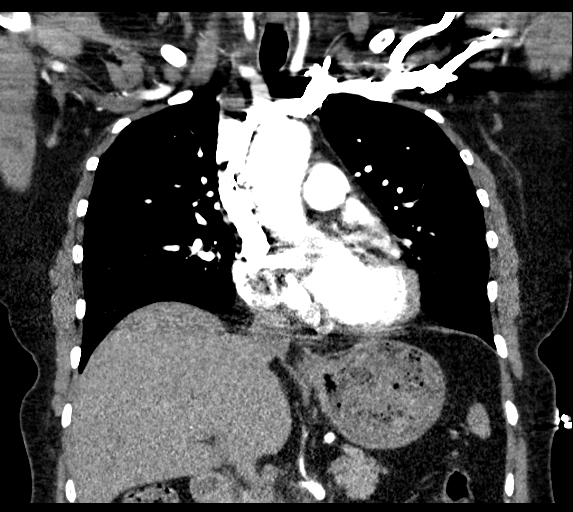

[19 of 36 positions shown; findings below may reference images not displayed]

FINDINGS: Cardiovascular: Satisfactory opacification of the pulmonary arteries
to the segmental level. No evidence of pulmonary embolism. Normal
heart size. No pericardial effusion.

Mediastinum/Nodes: No enlarged mediastinal, hilar, or axillary lymph
nodes. Thyroid gland, trachea, and esophagus demonstrate no
significant findings.

Lungs/Pleura: Very mild areas of atelectasis and/or infiltrate are
seen along the periphery of the left upper lobe and posterior aspect
of the right lung base.

There is no evidence of a pleural effusion or pneumothorax.

Upper Abdomen: There is a small hiatal hernia.

Musculoskeletal: No chest wall abnormality. No acute or significant
osseous findings.

Review of the MIP images confirms the above findings.
IMPRESSION: 1. No CT evidence of pulmonary embolism.
2. Very mild areas of atelectasis and/or infiltrate along the
periphery of the left upper lobe and posterior aspect of the right
lung base.
3. Small hiatal hernia.
